# Patient Record
Sex: Female | Born: 1940 | Hispanic: Yes | Marital: Single | Smoking: Never smoker
Health system: Southern US, Community
[De-identification: ages and names within clinical notes are randomized; demographics above are authoritative.]

## PROBLEM LIST (undated history)

## (undated) DIAGNOSIS — Q446 Cystic disease of liver: Secondary | ICD-10-CM

## (undated) DIAGNOSIS — Z8619 Personal history of other infectious and parasitic diseases: Secondary | ICD-10-CM

## (undated) DIAGNOSIS — M810 Age-related osteoporosis without current pathological fracture: Secondary | ICD-10-CM

## (undated) DIAGNOSIS — K7689 Other specified diseases of liver: Secondary | ICD-10-CM

## (undated) DIAGNOSIS — N200 Calculus of kidney: Secondary | ICD-10-CM

## (undated) DIAGNOSIS — K2100 Gastro-esophageal reflux disease with esophagitis, without bleeding: Secondary | ICD-10-CM

## (undated) DIAGNOSIS — E7800 Pure hypercholesterolemia, unspecified: Secondary | ICD-10-CM

## (undated) DIAGNOSIS — K589 Irritable bowel syndrome without diarrhea: Secondary | ICD-10-CM

## (undated) DIAGNOSIS — Z860100 Personal history of colon polyps, unspecified: Secondary | ICD-10-CM

## (undated) HISTORY — DX: Gastro-esophageal reflux disease with esophagitis, without bleeding: K21.00

## (undated) HISTORY — DX: Irritable bowel syndrome without diarrhea: K58.9

## (undated) HISTORY — DX: Irritable bowel syndrome, unspecified: K58.9

## (undated) HISTORY — DX: Personal history of other infectious and parasitic diseases: Z86.19

## (undated) HISTORY — PX: CHOLECYSTECTOMY: SHX55

## (undated) HISTORY — DX: Pure hypercholesterolemia, unspecified: E78.00

## (undated) HISTORY — DX: Other specified diseases of liver: K76.89

## (undated) HISTORY — DX: Cystic disease of liver: Q44.6

## (undated) HISTORY — DX: Age-related osteoporosis without current pathological fracture: M81.0

## (undated) HISTORY — DX: Personal history of colonic polyps: Z86.010

## (undated) HISTORY — DX: Personal history of colon polyps, unspecified: Z86.0100

## (undated) HISTORY — DX: Calculus of kidney: N20.0

## (undated) HISTORY — PX: LITHOTRIPSY: SUR834

---

## 2005-12-19 HISTORY — PX: ESOPHAGOGASTRODUODENOSCOPY: SHX1529

## 2014-09-25 HISTORY — PX: COLONOSCOPY: SHX174

## 2019-10-04 ENCOUNTER — Encounter: Payer: Self-pay | Admitting: Gastroenterology

## 2019-11-02 ENCOUNTER — Other Ambulatory Visit: Payer: Self-pay

## 2019-11-02 ENCOUNTER — Ambulatory Visit (INDEPENDENT_AMBULATORY_CARE_PROVIDER_SITE_OTHER): Payer: Medicare Other | Admitting: Gastroenterology

## 2019-11-02 ENCOUNTER — Encounter: Payer: Self-pay | Admitting: Gastroenterology

## 2019-11-02 VITALS — BP 138/82 | HR 98 | Temp 97.1°F | Wt 139.5 lb

## 2019-11-02 DIAGNOSIS — K219 Gastro-esophageal reflux disease without esophagitis: Secondary | ICD-10-CM | POA: Diagnosis not present

## 2019-11-02 MED ORDER — PANTOPRAZOLE SODIUM 40 MG PO TBEC
40.0000 mg | DELAYED_RELEASE_TABLET | Freq: Every day | ORAL | 6 refills | Status: DC
Start: 2019-11-02 — End: 2020-11-24

## 2019-11-02 NOTE — Progress Notes (Signed)
Chief Complaint:   Referring Provider:  No ref. provider found      ASSESSMENT AND PLAN;   #1. 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 IBS-C with Lower abd pain, better with defecation. Neg colon 03-Oct-2014 except for pancolonic diverticulosis.  Plan: - Restart protonix 40mg  po qd #30, 6 refills. - UGI series. Pl give Ba tab as well. -Start colace 1 tab po qd. - FU in 12 weeks. If still with problems, then EGD followed by CT Abdo/pelvis.  Patient patient's daughter agrees with the above plan.   HPI:    Pamela Tate is a 79 y.o. 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 remote history of EGD.  She again started having more problems with reflux ever since she ran out of Protonix 3 months ago.  No odynophagia or dysphagia.  She does have longstanding history of GI problems and diagnosed with IBS-C.  Lately her constipation is much better on high-fiber diet.  She continues to have longstanding abdominal bloating and lower abdominal discomfort which gets better with defecation.  No melena or hematochezia.  No weight loss.  Has some nausea but no vomiting.  Past GI procedures: -Colonoscopy 09/25/2014 ([facility]): Good preparation.  Pancolonic diverticulosis predominantly in the sigmoid colon.  Otherwise normal colonoscopy to TI.  No need to repeat unless new problems. -CT AP without contrast 12/2016: Large distal esophageal diverticulum 4.4 cm.  Well-defined stable liver lesions.  Extensive colonic diverticulosis without diverticulitis.  Aortic atherosclerosis.  Otherwise normal.  She did have compression fracture of T12 Past Medical History:  Diagnosis Date  . Benign liver cyst   . Congenital cystic disease of liver   . GERD with esophagitis    and HH  . History of colon polyps   . History of Helicobacter pylori infection   . Hypercholesterolemia   . IBS  (irritable bowel syndrome)   . Osteoporosis     Past Surgical History:  Procedure Laterality Date  . CHOLECYSTECTOMY    . COLONOSCOPY  09/25/2014   Pancolonic diverticulosis predominantly in the sigmoid colon. Otherwise, normal colonoscopy to terminal ileum  . ESOPHAGOGASTRODUODENOSCOPY  12/2005   [place]  . LITHOTRIPSY      Family History  Problem Relation Age of Onset  . Colon cancer Son        died Oct 02, 2005 was 40 years   . Esophageal cancer Neg Hx     Social History   Tobacco Use  . Smoking status: Never Smoker  . Smokeless tobacco: Never Used  Substance Use Topics  . Alcohol use: Not Currently  . Drug use: Never    No current outpatient medications on file.   No current facility-administered medications for this visit.    Allergies  Allergen Reactions  . Contrast Media [Iodinated Diagnostic Agents]     Review of Systems:  Constitutional: Denies fever, chills, diaphoresis, appetite change and fatigue.  HEENT: Denies photophobia, eye pain, redness, hearing loss, ear pain, congestion, sore throat, rhinorrhea, sneezing, mouth sores, neck pain, neck stiffness and tinnitus.   Respiratory: Denies SOB, DOE, cough, chest tightness,  and wheezing.   Cardiovascular: Denies chest pain, palpitations and leg swelling.  Genitourinary: Denies dysuria, urgency, frequency, hematuria, flank pain and difficulty urinating.  Musculoskeletal: Denies myalgias, back pain, joint swelling, arthralgias and gait problem.  Skin: No rash.  Neurological: Denies dizziness, seizures, syncope, weakness, light-headedness, numbness  and headaches.  Hematological: Denies adenopathy. Easy bruising, personal or family bleeding history  Psychiatric/Behavioral: No anxiety or depression     Physical Exam:    BP 138/82   Pulse 98   Temp (!) 97.1 F (36.2 C)   Wt 139 lb 8 oz (63.3 kg)  Wt Readings from Last 3 Encounters:  11/02/19 139 lb 8 oz (63.3 kg)   Constitutional:  Well-developed, in no acute  distress. Psychiatric: Normal mood and affect. Behavior is normal. HEENT: Pupils normal.  Conjunctivae are normal. No scleral icterus. Neck supple.  Cardiovascular: Normal rate, regular rhythm. No edema Pulmonary/chest: Effort normal and breath sounds normal. No wheezing, rales or rhonchi. Abdominal: Soft, nondistended. Nontender. Bowel sounds active throughout. There are no masses palpable. No hepatomegaly. Rectal:  defered Neurological: Alert and oriented to person place and time. Skin: Skin is warm and dry. No rashes noted.    Carmell Austria, MD 11/02/2019, 10:45 AM  Cc: No ref. provider found

## 2019-11-02 NOTE — Patient Instructions (Addendum)
If you are age 78 or older, your body mass index should be between 23-30. Your There is no height or weight on file to calculate BMI. If this is out of the aforementioned range listed, please consider follow up with your Primary Care Provider.  If you are age 43 or younger, your body mass index should be between 19-25. Your There is no height or weight on file to calculate BMI. If this is out of the aformentioned range listed, please consider follow up with your Primary Care Provider.   You have been scheduled for an Upper GI Series at [facility]. Your appointment is on 11/08/19 at 9:30 am. Please arrive 15 minutes prior to your test for registration. Make sure not to eat or drink anything after midnight on the night before your test. If you need to reschedule, please call radiology at [phone]. ________________________________________________________________ An upper GI series uses x rays to help diagnose problems of the upper GI tract, which includes the esophagus, stomach, and duodenum. The duodenum is the first part of the small intestine. An upper GI series is conducted by a radiology technologist or a radiologist--a doctor who specializes in x-ray imaging--at a hospital or outpatient center. While sitting or standing in front of an x-ray machine, the patient drinks barium liquid, which is often white and has a chalky consistency and taste. The barium liquid coats the lining of the upper GI tract and makes signs of disease show up more clearly on x rays. X-ray video, called fluoroscopy, is used to view the barium liquid moving through the esophagus, stomach, and duodenum. Additional x rays and fluoroscopy are performed while the patient lies on an x-ray table. To fully coat the upper GI tract with barium liquid, the technologist or radiologist may press on the abdomen or ask the patient to change position. Patients hold still in various positions, allowing the technologist or radiologist  to take x rays of the upper GI tract at different angles. If a technologist conducts the upper GI series, a radiologist will later examine the images to look for problems.  This test typically takes about 1 hour to complete. __________________________________________________________________  RESTART Pantoprazole 40 mg 1 capsule twice daily. This has been sent to your pharmacy.  Start taking Colace, 1 tablet daily. You can pick this up over the counter at your pharamcy.  Call and schedule a follow up in 12 week.

## 2019-11-08 ENCOUNTER — Ambulatory Visit (HOSPITAL_COMMUNITY)
Admission: RE | Admit: 2019-11-08 | Discharge: 2019-11-08 | Disposition: A | Discharge status: Home / Self Care | Payer: Medicare Other | Source: Ambulatory Visit | Attending: Gastroenterology | Admitting: Gastroenterology

## 2019-11-08 ENCOUNTER — Encounter (HOSPITAL_COMMUNITY): Payer: Self-pay | Admitting: Radiology

## 2019-11-08 DIAGNOSIS — K219 Gastro-esophageal reflux disease without esophagitis: Secondary | ICD-10-CM

## 2019-11-12 ENCOUNTER — Telehealth: Payer: Self-pay | Admitting: Gastroenterology

## 2020-06-11 ENCOUNTER — Emergency Department (HOSPITAL_COMMUNITY): Payer: No Typology Code available for payment source

## 2020-06-11 ENCOUNTER — Emergency Department (HOSPITAL_COMMUNITY)
Admission: EM | Admit: 2020-06-11 | Discharge: 2020-06-11 | Disposition: A | Discharge status: Home / Self Care | Payer: No Typology Code available for payment source | Attending: Emergency Medicine | Admitting: Emergency Medicine

## 2020-06-11 ENCOUNTER — Encounter (HOSPITAL_COMMUNITY): Payer: Self-pay

## 2020-06-11 DIAGNOSIS — R0789 Other chest pain: Secondary | ICD-10-CM

## 2020-06-11 DIAGNOSIS — N644 Mastodynia: Secondary | ICD-10-CM | POA: Diagnosis not present

## 2020-06-11 DIAGNOSIS — Y92410 Unspecified street and highway as the place of occurrence of the external cause: Secondary | ICD-10-CM | POA: Diagnosis not present

## 2020-06-11 DIAGNOSIS — M25571 Pain in right ankle and joints of right foot: Secondary | ICD-10-CM | POA: Diagnosis not present

## 2020-06-11 MED ORDER — ACETAMINOPHEN 500 MG PO TABS
1000.0000 mg | ORAL_TABLET | Freq: Once | ORAL | Status: AC
  Administered 2020-06-11: 1000 mg via ORAL
  Filled 2020-06-11: qty 2
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
## 2020-06-11 NOTE — Discharge Instructions (Addendum)
Please read instructions below. Apply ice to your areas of pain for 20 minutes at a time. You can take tylenol every 6 hours as needed for pain. Schedule an appointment with your primary care provider.  Return to the ER for difficulty breathing, abdominal pain, or worsening symptoms.

## 2020-06-11 NOTE — ED Notes (Signed)
Pt returned from X Ray.

## 2020-11-22 ENCOUNTER — Other Ambulatory Visit: Payer: Self-pay | Admitting: Gastroenterology

## 2021-07-13 NOTE — Telephone Encounter (Signed)
Closing encounter
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
## 2024-05-08 NOTE — ED Provider Notes (Signed)
 " [facility] Emergency Department Emergency Department Provider Note  Provider at Bedside:  05/08/2024 12:46 AM  Chief Complaint: Dizziness, emesis, numbness  History of Present Illness:  History obtained from: Patient Patient's daughter is present at bedside and is translating for the patient.   Pamela Tate is a 83 y.o. female with PMHx of GERD and HLD who presents to the ED with complaints of intermittent dizziness, palpitations, and emesis, onset 3 days ago. Patient last vomited 2 days ago. Patient's daughter at bedside notes numbness in the left leg has been intermittent for some time now and is not new. Patient states numbness only occurs after sitting down for a long period of time. Patient was seen at urgent care around [facility] today and was found to be hypertensive. Patient denies history of HTN but her mother had a history of HTN and passed away after a stroke. Patient denies change in vision or change in speech. Patient denies any other medical complaints at this time.  ______________________ ROS: Pertinent positives and negatives per HPI. Pertinent past medical, surgical, social and family history records were reviewed. Current Medications and Allergies were reviewed.  Physical Exam   Vitals:   05/07/24 2133 05/08/24 0033 05/08/24 0245  BP: (!) 179/80 (!) 170/84 143/83  BP Location: Right arm    Patient Position: Sitting    Pulse: (!) 18 105 99  Resp: 18 18 (!) 22  Temp: 97.7 F (36.5 C)    TempSrc: Oral    SpO2: 95% 96% 96%  Weight: 66.8 kg (147 lb 3.2 oz)    Height: 149.9 cm (4' 11)        Physical Exam Vitals and nursing note reviewed.  Constitutional:      General: She is not in acute distress.    Appearance: Normal appearance.  HENT:     Head: Normocephalic and atraumatic.     Right Ear: External ear normal.     Left Ear: External ear normal.     Nose: Nose normal.     Mouth/Throat:     Mouth: Mucous membranes are moist.  Eyes:      Pupils: Pupils are equal, round, and reactive to light.  Cardiovascular:     Rate and Rhythm: Regular rhythm. Tachycardia present.     Heart sounds: Normal heart sounds.  Pulmonary:     Effort: Pulmonary effort is normal.     Breath sounds: Normal breath sounds.  Abdominal:     Palpations: Abdomen is soft.     Tenderness: There is no abdominal tenderness.  Musculoskeletal:        General: Normal range of motion.     Cervical back: Normal range of motion.  Skin:    General: Skin is warm.  Neurological:     General: No focal deficit present.     Mental Status: She is alert and oriented to person, place, and time.     Cranial Nerves: Cranial nerves 2-12 are intact. No cranial nerve deficit.     Sensory: Sensation is intact. No sensory deficit.     Motor: Weakness (difficulty lifting the right lower extremity due to history of osteoarthritis) present.  Psychiatric:        Mood and Affect: Mood normal.        Behavior: Behavior normal.        Thought Content: Thought content normal.     Results   EKG Impression:  My Interpretation: EKG For acute MI, arrhythmia, conduction abnormality, ischemia, electrolyte  abnormality was performed and showed EKG time: 0117 Interpretation time: 9:48 PM Rate: 108 Rhythm: sinus tachycardia Axis: Normal Intervals: RBBB and possible left atrial enlargement ST-T Waves: Normal ST-T Waves Comparison with Old:  Yes -no significant changes compared to December 08, 2014.  Labs: Lab Results (last 24 hours)     Procedure Component Value Ref Range Date/Time   Troponin, High Sensitive (0 Hr + 2 Hr Rfx) [[id number]]  (Normal) Collected: 05/08/24 0130   Lab Status: Final result Specimen: Blood from Venous Updated: 05/08/24 0219    Troponin, High Sensitive 4 <15 ng/L     Comment: >= 15 ng/L INDICATES MYOCARDIAL DAMAGE.THE DIAGNOSIS OF MYOCARDIAL INFARCTION REQUIRES CLINICAL CORRELATION.   Elevated troponin may also be due to myocardial stress from a variety  of causes.   Alkaline Phos (ALP) levels >400 U/L may cause falsely elevated results. Troponin test is invalid in patients taking asfotase alpha.   This troponin assay was not validated for evaluation of troponin in patients younger than 21 years. There are no ranges established for patients younger than 21 years. Therefore, laboratory results for these patients should be  interpreted with caution.       SARS-Cov-2, Flu, and RSV, Qualitative NAAT [[id number]]  (Normal) Collected: 05/08/24 0121   Lab Status: Final result Specimen: Swab from Nasopharynx Updated: 05/08/24 0217    SARS-CoV-2 Negative Negative     Influenza A Negative Negative     Influenza B Negative Negative     RSV Negative Negative    Narrative:     Results are for use in the simultaneous rapid in vitro detection and differentiation of SARS-CoV-2, RSV, influenza A virus, and influenza B virus nucleic acids by PCR in clinical specimens.   Positive results are indicative of active infection but do not rule out bacterial infection or co-infection with other pathogens not detected by the test. Clinical correlation with patient history and other diagnostic information is necessary to determine patient infection status. The agent detected may not be the definite cause of disease.   Negative results do not preclude SARS-CoV-2, RSV, influenza A, and/or influenza B infection and should not be used as the sole basis for diagnosis, treatment or other patient management decisions. Negative results must be combined with clinical observations, patient history, and/or epidemiological information.   Test performed by [facility] qualified personnel using the Cepheid Xpert SARS-CoV-2 & Influenza A/B Nucleic acid test on the GeneXpert instrument.    Urinalysis with Reflex to Microscopic [[id number]]  (Abnormal) Collected: 05/08/24 0121   Lab Status: Final result Specimen: Urine from Clean Catch Updated: 05/08/24 0139    Color, Urine Colorless Yellow      Clarity, Urine Clear Clear     Specific Gravity, Urine 1.003* 1.005 - 1.025     pH, Urine 5.5 5.0 - 8.0     Protein, Urine Negative Negative, 10 , 20  mg/dL     Glucose, Urine Negative Negative, 30 , 50  mg/dL     Ketones, Urine Negative Negative, Trace mg/dL     Bilirubin, Urine Negative Negative     Blood, Urine Negative Negative, Trace     Nitrite, Urine Negative Negative     Leukocyte Esterase, Urine 25 Negative, 25      Urobilinogen, Urine Normal <2.0 mg/dL     WBC, Urine 0-5 <6 /HPF     RBC, Urine 0-2 0 - 2 /HPF     Bacteria, Urine None Seen None Seen, Rare /HPF     Squamous  Epithelial Cells, Urine 0-5 0 - 5 /HPF    Gold SST Hold Tube [[id number]] Collected: 05/07/24 2245   Lab Status: Final result Specimen: Blood from Venous Updated: 05/08/24 1106    AH Hold Specimen for Add-Ons Auto Resulted.   CBC with Differential [[id number]]  (Abnormal) Collected: 05/07/24 2244   Lab Status: Final result Specimen: Blood from Venous Updated: 05/07/24 2329   Narrative:     The following orders were created for panel order CBC with Differential. Procedure                               Abnormality         Status                    ---------                               -----------         ------                    CBC with [id number]]       Abnormal            Final result              Morphology [id number]]                               Final result               Please view results for these tests on the individual orders.   Comprehensive Metabolic Panel [[id number]] Collected: 05/07/24 2244   Lab Status: Final result Specimen: Blood from Venous Updated: 05/07/24 2320    Sodium 139 136 - 145 mmol/L     Potassium 4.2 3.4 - 4.5 mmol/L     Comment: Slight Hemolysis      Chloride 105 98 - 107 mmol/L     CO2 26 21 - 31 mmol/L     Comment: High lactate dehydrogenase (LDH) concentrations in patient samples may cause falsely increased bicarbonate results. If markedly  elevated LDH levels are suspected, please assess results in conjunction with patient's LDH values.      Anion Gap 8 6 - 14 mmol/L     Glucose, Random 97 70 - 99 mg/dL     Blood Urea Nitrogen (BUN) 10 7 - 25 mg/dL     Creatinine 9.33 9.39 - 1.20 mg/dL     eGFR 88 >40 fO/fpw/8.26f7     Comment: GFR estimated by CKD-EPI equations(NKF 2021).   Recommend confirmation of Cr-based eGFR by using Cys-based eGFR and other filtration markers (if applicable) in complex cases and clinical decision-making, as needed.      Albumin 4.3 3.5 - 5.7 g/dL     Total Protein 7.2 6.4 - 8.9 g/dL     Bilirubin, Total 0.5 0.3 - 1.0 mg/dL     Alkaline Phosphatase (ALP) 87 34 - 104 U/L     Aspartate Aminotransferase (AST) 21 13 - 39 U/L     Comment: Slight Hemolysis      Alanine Aminotransferase (ALT) 21 7 - 52 U/L     Calcium 9.8 8.6 - 10.3 mg/dL     BUN/Creatinine Ratio --    Comment: Creatinine is normal, ratio is not clinically indicated.  Light [employer] Tube [[id number]] Collected: 05/07/24 2244   Lab Status: Final result Specimen: Blood from Venous Updated: 05/08/24 1106    AH Hold Specimen for Add-Ons Auto Resulted.   CBC with Differential [[id number]]  (Abnormal) Collected: 05/07/24 2244   Lab Status: Final result Specimen: Blood from Venous Updated: 05/07/24 2329    WBC 10.78 4.40 - 11.00 10*3/uL     RBC 4.46 4.10 - 5.10 10*6/uL     Hemoglobin 14.1 12.3 - 15.3 g/dL     Hematocrit 57.8 64.0 - 44.6 %     Mean Corpuscular Volume (MCV) 94.6 80.0 - 96.0 fL     Mean Corpuscular Hemoglobin (MCH) 31.7 27.5 - 33.2 pg     Mean Corpuscular Hemoglobin Conc (MCHC) 33.5 33.0 - 37.0 g/dL     Red Cell Distribution Width (RDW) 13.0 12.3 - 17.0 %     Platelet Count (PLT) --    Comment: Clumped platelets present, accurate platelet count not available.      Mean Platelet Volume (MPV) --    Comment: Result not available       Neutrophils % 57 %     Lymphocytes % 31 %     Monocytes % 11 %      Eosinophils % 1 %     Basophils % 1 %     Neutrophils Absolute 6.10 1.80 - 7.80 10*3/uL     Lymphocytes # 3.40 1.00 - 4.80 10*3/uL     Monocytes # 1.20* 0.00 - 0.80 10*3/uL     Eosinophils # 0.10 0.00 - 0.50 10*3/uL     Basophils # 0.10 0.00 - 0.20 10*3/uL    Morphology Review [[id number]] Collected: 05/07/24 2244   Lab Status: Final result Specimen: Blood from Venous Updated: 05/07/24 2329    RBC Morphology Reviewed and Unremarkable    WBC Morphology Reviewed and Unremarkable   Prothrombin Time (PT) with INR [[id number]]  (Normal) Collected: 05/07/24 2244   Lab Status: Final result Specimen: Blood from Venous Updated: 05/08/24 0119    Prothrombin Time (PT) 12.6 11.8 - 14.4 seconds     International Normalized Ratio (INR) 0.9 0.0 - 1.5        My interpretation of patient's labs show the CBC and CMP are essentially normal.  Patient's COVID flu and RSV was negative, urinalysis was negative, and initial and delta troponins were normal.  CXR Impression: (Interpreted by me) My Interpretation: Chest xray for pneumonia, pneumothorax, pleural effusion, pulmonary edema, cardiomegaly, aortic abnormality Was performed and showed cardiomegaly and maybe some patchy atelectasis versus infiltrate but patient did not have any fever or cough..  Impression of additional imaging studies include: MRI of the brain was performed and did not show any acute explanation of his intermittent dizziness.  Imaging: Radiology Results (last 72 hours)     Procedure Component Value Units Date/Time   MRI Brain WO Contrast [[id number]] Collected: 05/08/24 0222   Order Status: Completed Updated: 05/08/24 0717   Narrative:     MRI BRAIN WITHOUT CONTRAST, 05/08/2024 2:01 AM  INDICATION: Neuro deficit, acute, stroke suspected   COMPARISON: None  TECHNIQUE: Multiplanar, multi-sequence magnetic resonance imaging of the brain was performed without contrast.  FINDINGS: Calvarium/skull base: No focal marrow  replacing lesion.  Orbits: No mass lesion.  Paranasal sinuses: No air-fluid levels.  Brain: No evidence of acute infarct. No mass effect. No evidence of acute hemorrhage. No hydrocephalus. No significant white matter disease for the patient's age. Normal appearance of the major intracranial  arteries and dural venous sinuses.    Impression:     No acute intracranial abnormality. Specifically, no acute infarction.   XR Chest 1 View [[id number]] Collected: 05/08/24 0158   Order Status: Completed Updated: 05/08/24 0720   Narrative:     XR CHEST 1 VIEW, 05/08/2024 1:33 AM  INDICATION:palpitations  COMPARISON: CT abdomen/pelvis 12/08/2014.  FINDINGS:   Supportive devices: None. Cardiovascular/lungs/pleura: Cardiomegaly. Patchy left greater than right bibasilar opacities with trace left costophrenic angle blunting. No pleural effusion or pneumothorax.  Other: Polyarticular degenerative changes.    Impression:     1.  Patchy left greater than right bibasilar opacities which may reflect a combination of aspiration, atelectasis, and possibly infection with a possible trace left pleural effusion. 2.  Cardiomegaly. 3.           Procedures   Procedures  Evidence Based Calculators      ED Course   ED Course as of 05/08/24 2148  Tue May 08, 2024  0055 Patient's initial vital signs showed that her blood pressure was mildly elevated. [LT]  0055 Intervention-patient will get routine labs, cardiac enzymes, EKG, chest x-ray, urinalysis, and will get MRI of her head to further evaluate her symptoms.  Patient cannot get a CTA of the head or neck because she is allergic to contrast dye. [LT]  0130 Intervention-patient was given some IV Valium to go through MRI. [LT]  0254 Patient's CBC and CMP were essentially normal her urinalysis was negative her initial delta troponins were normal and COVID flu and RSV were negative.  Chest x-ray did not show any acute findings and MRI of the brain was  negative as well. [LT]  0255 As patient sat here and relaxed in the ER patient's blood pressure did come down to 143/83.  The daughter stated that patient's blood pressures been going high every day and she has been having these unusual symptoms.  I had a long discussion with the patient and they are going to have her follow-up with her primary care doctor as an outpatient but the daughter is going to check her blood pressure frequently over the next 2 or 3 days.  We are going to call in some amlodipine 5 mg to start taking if her blood pressure continues to be high and in the meantime she is going to follow-up with her primary care doctor as an outpatient. [LT]  0255 Disposition-discharge. [LT]    ED Course User Index [LT] Rock Dannielle Birmingham, MD    Medical Decision Making   External records were reviewed: Reviewed note from PCP office visit on 01/19/2024 which showed patient was seen for annual exam. Patient has noted history of Vitamin D deficiency and HLD.  _________________________  Devorah Beam is a 83 y.o. female who presents to the ED with complaints of intermittent dizziness, palpitations, and emesis, onset 3 days ago. Patient last vomited 2 days ago. Patient's daughter at bedside notes numbness in the left leg has been intermittent for some time now and is not new. Patient states numbness only occurs after sitting down for a long period of time. Patient was seen at urgent care around [facility] today and was found to be hypertensive. Patient denies history of HTN but her mother had a history of HTN and passed away after a stroke.. On my initial evaluation, patient is mildly hypertensive but is otherwise in no acute distress.  She is also mildly tachycardic.  The following differentials were considered: CVA, TIA, migraine, tension headache, metabolic disorder,  SAH, tumor, MS, sinusitis .   Pertinent studies were obtained, with results listed in chart above.  results interpreted as  above.  Based on ED workup, findings are consistent with palpitations, dizziness, hypertension.    Patient received no medications in the ER.  On reevaluation, symptoms are improved.  Clinical Assessment/Plan: Patient presented to the ER with chronic intermittent dizziness palpitations and intermittent nausea and vomiting for the past couple of days.  Patient never had any episodes of vomiting in the ER.  He had a complete workup including MRI of the brain in the ER which was negative for any acute findings.  He never had any symptoms while here.  Patient's blood pressure actually stayed mildly elevated the entire time he was in the ER but then like right at the end it went down.  So we decided to write him for 5 mg of amlodipine and the daughter is going take his blood pressure over the next several days to see if it continues to be high.  If he continues to be high she is going to get the prescription and start him on 5 mg of amlodipine which could be causing his symptoms if he is running hypertensive.  Patient was told to return to the ER of course if condition worsens and otherwise I told her to just make sure he follows up with his primary care doctor as an outpatient.  Discussion of management or test interpretation with external provider(s): No external providers needed   Clinical Complexity/Risk   Patient's presentation is most consistent with acute presentation with potential threat to life or bodily function.  Patient's impaired access to primary care increases the complexity of managing their  presentation with hypertension and palpitations.     Provider time spent in patient care today, inclusive of but not limited to clinical reassessment, review of diagnostic studies, and discharge preparation, was greater than 30 minutes.  OTC medications: no, none Prescription medications discussed: yes, amlodipine 5 mg  ED Clinical Impression   Diagnoses that have been ruled out:  None   Diagnoses that are still under consideration:  None  Final diagnoses:  Palpitations  Dizziness  Hypertension, unspecified type    ED Assessment/Plan   ED Disposition     ED Disposition  Discharge   Condition  Stable   Comment  --         DISCHARGE MEDICATIONS   Medication List     START taking these medications    amLODIPine 5 mg tablet Commonly known as: NORVASC Take 1 tablet (5 mg total) by mouth daily.       ASK your doctor about these medications    conjugated estrogens 0.625 mg/gram vaginal cream Commonly known as: Premarin Fourth applicator vagina on Mondays and Thursdays at bedtime   dicyclomine 10 mg capsule Commonly known as: BENTYL Take 1 capsule (10 mg total) by mouth 3 (three) times a day with meals as needed (for bloating or abdominal pain).   erythromycin 5 mg/gram (0.5 %) ophthalmic ointment Commonly known as: ROMYCIN Apply 0.5 inches to both eyes at bedtime.   pantoprazole  40 mg EC tablet Commonly known as: PROTONIX  Take 1 tablet (40 mg total) by mouth every morning before breakfast.         Where to Get Your Medications     These medications were sent to [facility] - [place], [place] - [address] - PHONE: [phone] - FAX: [phone]  [address] [place] [address]  Phone: [phone]  amLODIPine 5 mg tablet     FOLLOW UP Truman CINDERELLA Romney, MD [address] [place] [address] [phone]   as soon as possible for further evaluation and recheck as an outpatient.   ____________________________ Scribe's Attestation: This document serves as a record of services personally performed by Rock Birmingham, MD. It was created on their behalf by Lavanda Pang, Scribe, a trained medical scribe. The creation of this record is the providers dictation and/or activities during the visit.   Electronically signed by: Rock Dannielle Birmingham, MD 05/08/2024 12:29 AM      *Some images could not be shown."

## 2024-07-02 NOTE — Progress Notes (Unsigned)
 "    07/03/2024 Pamela Tate [id number] 1940/11/04  Referring provider: Andrew Truman GRADE., MD Primary GI doctor: Dr. Charlanne  ASSESSMENT AND PLAN:  AB pain, nausea, dizziness, with diarrhea, she then had rectal bleeding yesterday on TP, small volume likely 09/2014 colon unremarkable other than tics  07/01/2024 CTABP in ER large bowel patten colitis of the left and sigmoid colon, diverticulitis possible Acute, uncomplicated episode with mild persistent pain. Discussed risk of complications including perforation and abscess. Anticipated improvement with antibiotic therapy. - Initiated Augmentin. - Prescribed antispasmodic medication as needed. - Recommended acetaminophen  and heating pad for pain management as needed. - Discussed use of omeprazole or pantoprazole  for gastric protection if indicated, with instruction to use only one agent at a time. - Provided diverticulitis diet instructions in Spanish. - Scheduled follow-up in two months. - Instructed to call for worsening symptoms, persistent pain, ongoing rectal bleeding, or lack of improvement. - Discussed possible colonoscopy in 8 weeks pending input from her primary gastroenterologist, schedule follow up 8 weeks with Dr. Charlanne - Agreed to message her primary gastroenterologist regarding colonoscopy recommendation and medication history, she prefers to discuss with Dr. Charlanne.  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  Pancolonic diverticulosis predominantly in the sigmoid colon.  Otherwise normal colonoscopy to TI.   No need to repeat unless new problems.  Patient Care Team: Andrew Truman GRADE., MD as PCP - General (Internal Medicine)  HISTORY OF PRESENT ILLNESS: 84 y.o. female with a past medical history listed below presents for evaluation of AB pain, nausea, went to ER with CT showing colitis.   Remotely seen by Dr. Charlanne 2021 for GERD with hiatal hernia esophageal diverticulum and IBS with constipation. She was prescribed Augmentin but she did not start it due to fear, wanted   Sunday at church sudden AB pain, dizziness, nausea, vomiting x 5, felt better but the AB pain continued.   Discussed the use of AI scribe software for clinical note transcription with the patient, who gave verbal consent to proceed.  History of Present Illness   Pamela Tate is an 84 year old female with constipation who presents for evaluation following a recent episode of abdominal pain and rectal bleeding.  On January 11th, she developed left lower quadrant abdominal pain accompanied by nausea and chills, without fever. The day prior, she experienced constipation, and on the day of the episode, passed a loose but not diarrheal stool. No blood was noted in the stool at that time. She was told during her ER visit that her CT scan showed inflammation in the sigmoid and left colon.  On January 12th, she observed minimal rectal bleeding, described as a small amount on toilet paper on two occasions, without large volume or blood in the toilet. No rectal pain was associated with the bleeding. She currently denies ongoing rectal bleeding.  Currently, she denies diarrhea and reports a formed bowel movement this morning. Abdominal pain persists but is not severe. Nausea has resolved. She denies fever, chills, urinary  symptoms, unintentional  weight loss, shortness of breath, and chest pain. She has not used antibiotics in the past six months. COVID and influenza tests were negative prior to this episode.  She has a history of intermittent constipation but generally has daily bowel movements. Medications at home include pantoprazole , omeprazole, and Tylenol  as needed for pain. She has previously used a medication for abdominal spasm with good effect. Allergies include aspirin and contrast used for CT scans.      She  reports that she has never smoked. She has never used smokeless tobacco. She reports that she does not currently use alcohol. She reports that she does not use drugs.  RELEVANT GI HISTORY, IMAGING AND LABS: Results   Labs Influenza: Negative  Radiology Abdominal and pelvic CT (07/01/2024): Sigmoid and left colon inflammation     -Colonoscopy 09/25/2014 ([facility]): Good preparation.  Pancolonic diverticulosis predominantly in the sigmoid colon.  Otherwise normal colonoscopy to TI.  No need to repeat unless new problems. -CT AP without contrast 12/2016: Large distal esophageal diverticulum 4.4 cm.  Well-defined stable liver lesions.  Extensive colonic diverticulosis without diverticulitis.  Aortic atherosclerosis.  Otherwise normal.  She did have compression fracture of T12  Current Medications:   Current Outpatient Medications (Other):    conjugated estrogens (PREMARIN) vaginal cream, Place 1 applicator vaginally as needed.   ergocalciferol (VITAMIN D2) 1.25 MG (50000 UT) capsule, Take 1 capsule by mouth once a week.   hyoscyamine  (LEVSIN ) 0.125 MG tablet, Take 1 tablet (0.125 mg total) by mouth every 6 (six) hours as needed for cramping.   ondansetron (ZOFRAN-ODT) 4 MG disintegrating tablet, Take 4 mg by mouth every 6 (six) hours as needed.   pantoprazole  (PROTONIX ) 40 MG tablet, Take 1 tablet (40 mg total) by mouth daily. Please call [phone] to schedule an office visit for more refills (Patient  taking differently: Take 1 tablet by mouth as needed. Please call [phone] to schedule an office visit for more refills)   amoxicillin-clavulanate (AUGMENTIN) 875-125 MG tablet, Take 1 tablet by mouth 2 (two) times daily. (Patient not taking: Reported on 07/03/2024)  Medical History:  Past Medical History:  Diagnosis Date   Benign liver cyst    Congenital cystic disease of liver    GERD with esophagitis    and HH   History of colon polyps    History of Helicobacter pylori infection    Hypercholesterolemia    IBS (irritable bowel syndrome)    Kidney stones    Osteoporosis    Allergies: Allergies[1]   Surgical History:  She  has a past surgical history that includes Cholecystectomy; Lithotripsy; Colonoscopy (09/25/2014); and Esophagogastroduodenoscopy (12/2005). Family History:  Her family history includes Colon cancer in her son; Hypertension in her father and mother; Stroke in her father and mother.  REVIEW OF SYSTEMS  : All other systems reviewed and negative except where noted in the History of Present Illness.  PHYSICAL EXAM: BP 120/60 (BP Location: Left Arm, Patient Position: Sitting, Cuff Size: Large)   Pulse 80   Ht 4' 9 (1.448 m)   Wt 141 lb 8 oz (64.2 kg)   BMI 30.62 kg/m  Physical Exam   GENERAL APPEARANCE: Well nourished, in no apparent distress. HEENT: No cervical lymphadenopathy, unremarkable thyroid, sclerae anicteric, conjunctiva pink. RESPIRATORY: Respiratory effort normal, breath sounds equal bilaterally without rales, rhonchi, or wheezing. CARDIO: Regular rate and rhythm with no murmurs, rubs, or gallops, peripheral pulses intact. ABDOMEN: Soft, non-distended, active bowel sounds in all four quadrants, tenderness to palpation, no  rebound tenderness, no mass appreciated. RECTAL: Declines. MUSCULOSKELETAL: Full range of motion, normal gait, without edema. SKIN: Dry, intact without rashes or lesions. No jaundice. NEURO: Alert, oriented, no focal  deficits. PSYCH: Cooperative, normal mood and affect.      Alan JONELLE Coombs, PA-C 11:59 AM      [1]  Allergies Allergen Reactions   Contrast Media [Iodinated Contrast Media]    "

## 2024-07-03 ENCOUNTER — Encounter: Payer: Self-pay | Admitting: Physician Assistant

## 2024-07-03 ENCOUNTER — Ambulatory Visit (INDEPENDENT_AMBULATORY_CARE_PROVIDER_SITE_OTHER): Admitting: Physician Assistant

## 2024-07-03 VITALS — BP 120/60 | HR 80 | Ht <= 58 in | Wt 141.5 lb

## 2024-07-03 DIAGNOSIS — K581 Irritable bowel syndrome with constipation: Secondary | ICD-10-CM

## 2024-07-03 DIAGNOSIS — K219 Gastro-esophageal reflux disease without esophagitis: Secondary | ICD-10-CM

## 2024-07-03 DIAGNOSIS — R1032 Left lower quadrant pain: Secondary | ICD-10-CM

## 2024-07-03 DIAGNOSIS — R933 Abnormal findings on diagnostic imaging of other parts of digestive tract: Secondary | ICD-10-CM | POA: Diagnosis not present

## 2024-07-03 DIAGNOSIS — K5732 Diverticulitis of large intestine without perforation or abscess without bleeding: Secondary | ICD-10-CM | POA: Diagnosis not present

## 2024-07-03 MED ORDER — HYOSCYAMINE SULFATE 0.125 MG PO TABS: 0.1250 mg | ORAL_TABLET | Freq: Four times a day (QID) | ORAL | 0 refills | Status: AC | PRN

## 2024-07-03 NOTE — Patient Instructions (Addendum)
 Por favor, tome Augmentin para la diverticulitis.  Puede tomar Levsin  al menos 1 o 2 veces al da para el dolor, si es necesario. Puede usar una almohadilla trmica y tomar Tylenol , con una dosis mxima de 3000 mg al c.h. robinson worldwide. Acuda a la sala de emergencias si no puede expulsar gases, si presenta dolor abdominal intenso, si no puede retener los alimentos, o si tiene dificultad para respirar o journalist, newspaper.  Cita de seguimiento en 2 meses; se considerar una colonoscopia en ese momento.  Durante un brote agudo (dieta de lquidos claros a baja en fibra) El objetivo es reducir la irritacin y permitir que el colon descanse. Das 1 a 3: Dieta de lquidos claros [employer] (de pollo, ternera o verduras) Jugos claros (manzana, uva blanca; evitar los ctricos) Paletas heladas sin pulpa ni semillas Gelatina (sin frutas ni semillas) T o caf (sin crema ni lcteos)  Despus de que mejoren los sntomas: Dieta baja en fibra Pase gradualmente a alimentos bajos en fibra para facilitar la digestin. Ejemplos de alimentos: Arroz blanco, pasta o pan blanco Verduras cocidas o enlatadas sin piel ni semillas (por ejemplo, zanahorias, judas verdes, patatas) Huevos, pescado o aves de corral Cereales bajos en fibra (como los copos de maz) Lcteos (si se toleran) Pltanos maduros, meln o fruta enlatada sin semillas  Mantenimiento a largo plazo: Dieta alta en fibra (una vez recuperado por completo) Esto ayuda a prevenir futuros brotes al kimberly-clark las deposiciones blandas y regulares. Alimentos ricos en fibra: Frutas: Manzanas (peladas), peras, bayas, ciruelas pasas Verduras: Brcoli, espinacas, calabacn, guisantes Cereales integrales: Avena, arroz integral, quinoa, pan integral Legumbres: Lentejas, garbanzos, frijoles negros (comience poco a poco para evitar gases) Frutos secos y semillas: Solo si se toleran (las investigaciones ya no los restringen, pero si nota que le provocan un brote, no los  consuma)

## 2024-07-16 ENCOUNTER — Other Ambulatory Visit (HOSPITAL_COMMUNITY): Payer: Self-pay

## 2024-07-16 ENCOUNTER — Telehealth: Payer: Self-pay

## 2024-07-16 NOTE — Telephone Encounter (Signed)
 Pharmacy Patient Advocate Encounter   Received notification from CoverMyMeds that prior authorization for Hyoscyamine  Sulfate 0.125MG  tablets  is required/requested.   Insurance verification completed.   The patient is insured through [facility]   Per test claim: Medication is not covered under Medicare Part D Law

## 2024-09-05 ENCOUNTER — Ambulatory Visit: Admitting: Gastroenterology
